# Patient Record
Sex: Male | Born: 1975 | Race: White | Hispanic: No | Marital: Single
Health system: Southern US, Community
[De-identification: ages and names within clinical notes are randomized; demographics above are authoritative.]

---

## 2007-09-12 ENCOUNTER — Emergency Department (HOSPITAL_COMMUNITY): Admission: EM | Admit: 2007-09-12 | Discharge: 2007-09-12 | Payer: Self-pay | Admitting: Emergency Medicine

## 2008-01-26 ENCOUNTER — Emergency Department (HOSPITAL_COMMUNITY): Admission: EM | Admit: 2008-01-26 | Discharge: 2008-01-26 | Payer: Self-pay | Admitting: Emergency Medicine

## 2008-01-29 ENCOUNTER — Inpatient Hospital Stay (HOSPITAL_COMMUNITY): Admission: EM | Admit: 2008-01-29 | Discharge: 2008-02-01 | Payer: Self-pay | Admitting: Emergency Medicine

## 2008-05-21 ENCOUNTER — Ambulatory Visit (HOSPITAL_BASED_OUTPATIENT_CLINIC_OR_DEPARTMENT_OTHER): Admission: RE | Admit: 2008-05-21 | Discharge: 2008-05-21 | Payer: Self-pay | Admitting: Orthopedic Surgery

## 2008-07-29 ENCOUNTER — Encounter: Admission: RE | Admit: 2008-07-29 | Discharge: 2008-08-27 | Payer: Self-pay | Admitting: Orthopedic Surgery

## 2009-09-03 IMAGING — CR DG ANKLE 3+V BILAT
6 series · 6 of 6 positions shown · non-contrast
Comparison: None available

CLINICAL DATA: Follow-up, pain in ankles

BILATERAL ANKLE 3+ VIEW

[t ankle joint ap left]
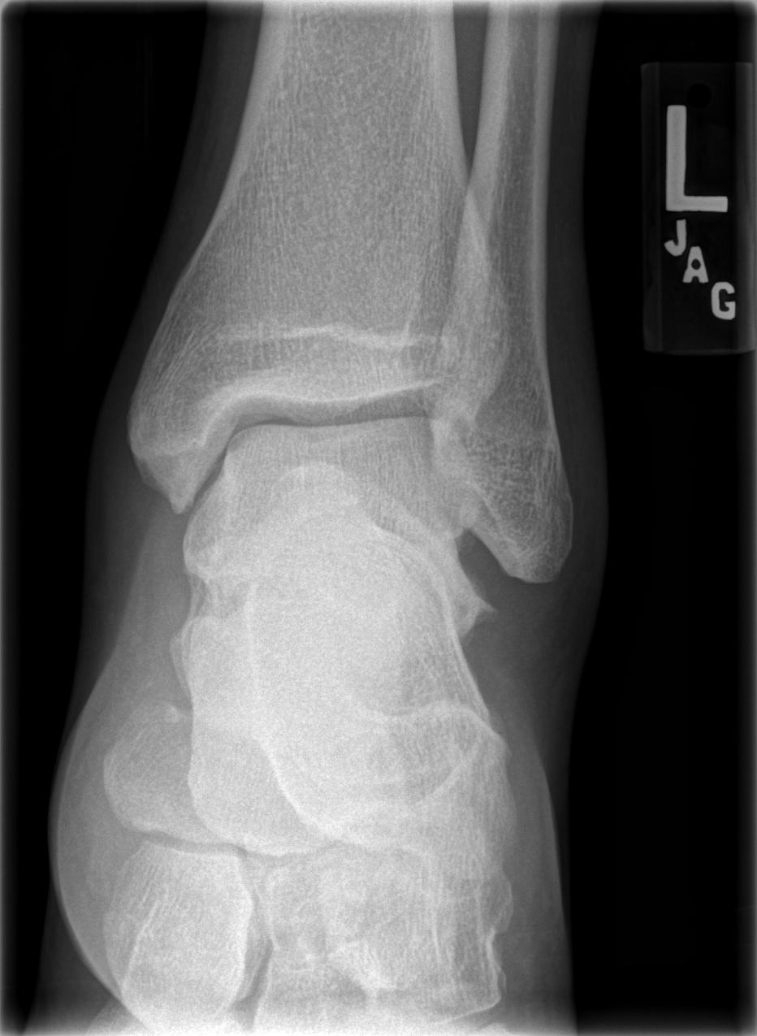

[t ankle joint oblique left]
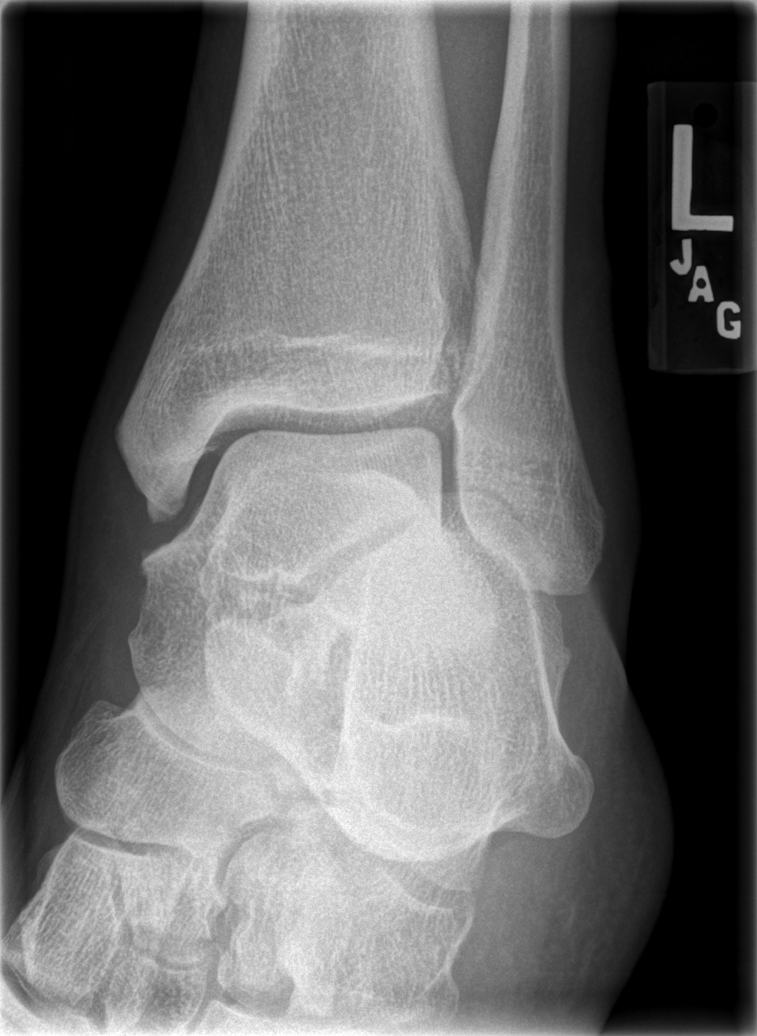

[t ankle joint ap right]
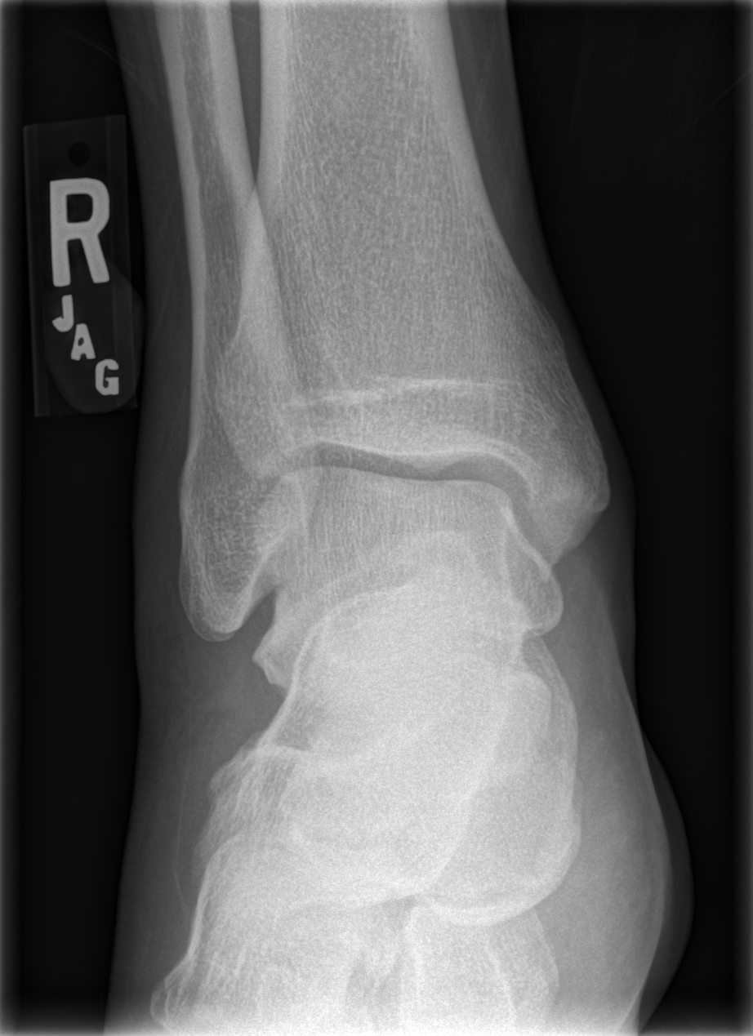

[t ankle joint oblique right]
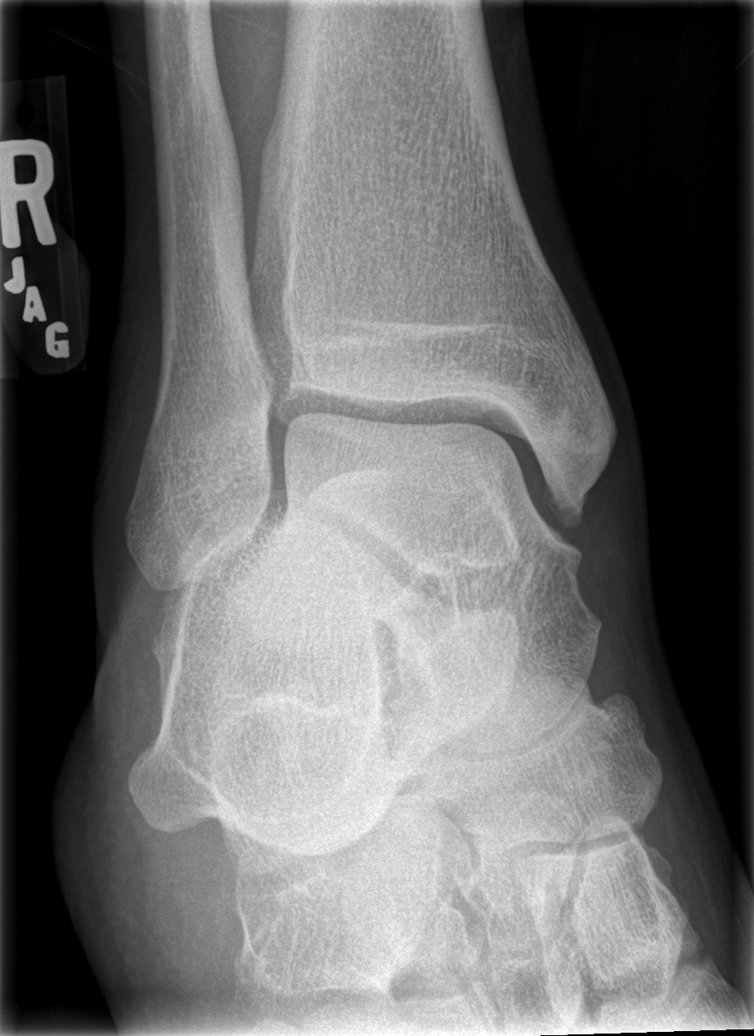

[t ankle joint lat right]
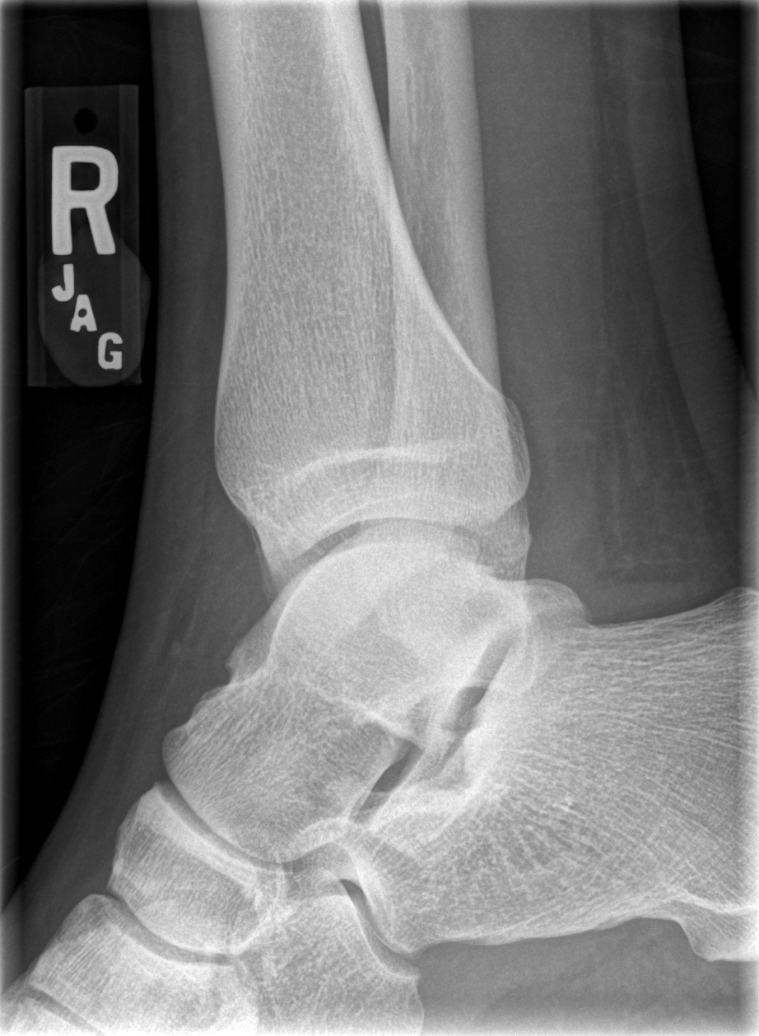

[t ankle joint lat left]
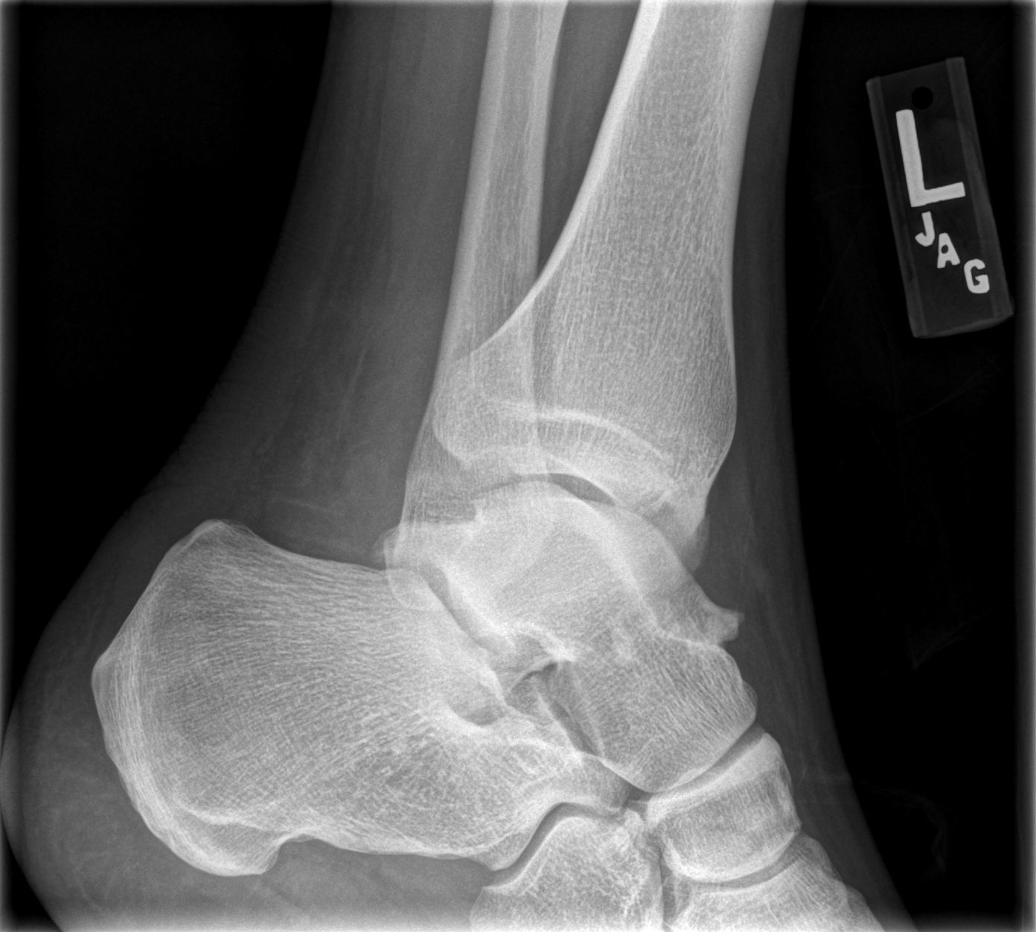

[6 of 6 positions shown; findings below may reference images not displayed]

FINDINGS: Ankle mortise is intact.  The talar dome is normal.  No
evidence of acute fracture.  There is a small fragment in the
medial aspect of the ankle mortise which could represent prior
avulsion fracture.
IMPRESSION: 1..  No evidence of acute osseous abnormality.

## 2011-03-02 NOTE — Op Note (Signed)
NAME:  Danny Saunders, Danny Saunders NO.:  [id number]   MEDICAL RECORD NO.:  [id number]          PATIENT TYPE:  AMB   LOCATION:  DSC                          FACILITY:  [facility]   PHYSICIAN:  Leonides Grills, M.D.     DATE OF BIRTH:  [birth year]   DATE OF PROCEDURE:  05/21/2008  DATE OF DISCHARGE:                               OPERATIVE REPORT   PREOPERATIVE DIAGNOSIS:  Chronic left Achilles tendon rupture.   POSTOPERATIVE DIAGNOSIS:  Chronic left Achilles tendon rupture.   OPERATION:  1. Left primary repair Achilles tendon.  2. Left gastroc slide.  3. Left FHL with calcaneal tendon transfer.   ANESTHESIA:  General.   SURGEON:  Leonides Grills, MD   ASSISTANT:  Richardean Canal, PA-C   ESTIMATED BLOOD LOSS:  Minimal.   TOURNIQUET TIME:  Approximately an hour and half.   COMPLICATIONS:  None.   DISPOSITION:  Stable to the PR.   INDICATIONS:  This 35 year old male who is about 6 weeks removed from a  left Achilles tendon rupture.  He was consented for the above procedure.  All risks including infection, neurovascular injury, rupture, persistent  pain, weakness of push-off great toe, and prolonged recovery were all  explained.  Questions were encouraged and answered.   OPERATION:  The patient was taken to the operating room and placed in  supine position after adequate general endotracheal tube anesthesia was  administered as well as Ancef 1 g IV piggyback.  The patient was then  placed in a full lateral position with the operative side down on a  beanbag.  All bony prominences were well padded.  Left lower extremity  was then prepped and draped in a sterile manner.  Over proximally placed  a thigh tourniquet.  The limb was gravity exsanguinated and tourniquet  was elevated to 290 mmHg.  A longitudinal incision over the medial  aspect of the gastrocnemius musculotendinous junction was then made.  Dissection was carried down through the skin and hemostasis was  obtained.  Fascia  was opened in line with the incision.  Conjoined  region was then developed between gastroc and soleus.  Soft tissue was  then elevated off the posterior aspect of the gastrocnemius.  Sural  nerves identified and protected posteriorly.  Gastrocnemius then  released with curved Mayo scissors.  This had an excellent release of  tight gastroc.  There was difficulty developing the conjoined region  between the gastroc and soleus but we were able to essentially do a  Vulpius type of gastroc release, copiously irrigated with normal saline.  Subcu was closed with 3-0 Vicryl.  The skin was closed with 4-0 Monocryl  and subcuticular stitch and Steri-Strips were applied.  We made a  longitudinal incision on the anterolateral aspect of the Achilles  tendon, dissection was carried down through the skin and hemostasis was  obtained.  Fascia was opened in line with the incision Achilles tendon  was then identified.  There was about a 3 or 4 cm gap in between the two  tendons.  A #5 FiberWire was then placed in a modified Krakow stitch  in  each end of the tendon.  The distal tendon was only about 3 cm in  length.  Once the stitch was placed,  we then identified the FHL tendon  muscle belly and then carefully traced this to the sustentaculum tali  and then tenotomized the tendon.  Care was taken to protect the  neurovascular structures medially.  Once this was tenotomized, we then  made a 7-mm drill hole over a cannulated wire into the superior aspect  of the calcaneal tuber.  Once drill hole was made, we then used an  Arthrex tenodesis 7 x 23 mm bioabsorbable screw and used this and placed  the FHL tendon in this drill hole using the tenodesis screw as an  interference screw using the Arthrex protocol.  This had an Interior and spatial designer.  This was done with the ankle in equinus position.  We then  placed the ankle in maximum equinus and did a primary Achilles tendon  repair pulling the two tendons together  using the #5 FiberWire stitch  that was placed in each tendon respectively.  Again this had an  excellent repair but it was tight.  The area was copiously irrigated  with normal saline.  Tourniquet was deflated and hemostasis was  obtained.  Subcu was closed with 2-0 Vicryl and the skin was closed with  4-0 nylon.  Sterile dressing was applied and a modified Jones dressing  was applied with gravity equinus.  The patient stable to the PR.      Leonides Grills, M.D.  Electronically Signed     PB/MEDQ  D:  05/21/2008  T:  05/22/2008  Job:  [id number]
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
## 2011-03-02 NOTE — Discharge Summary (Signed)
NAME:  Danny Saunders, Danny Saunders NO.:  [id number]   MEDICAL RECORD NO.:  [id number]          PATIENT TYPE:  INP   LOCATION:  5028                         FACILITY:  [facility]   PHYSICIAN:  Hillery Aldo, M.D.   DATE OF BIRTH:  [birth year]   DATE OF ADMISSION:  01/29/2008  DATE OF DISCHARGE:  02/01/2008                               DISCHARGE SUMMARY   PRIMARY CARE PHYSICIAN:  Unassigned.   RHEUMATOLOGIST:  Areatha Keas, M.D.   DISCHARGE DIAGNOSES:  1. Reiter's syndrome.  2. Chlamydia trachomatis.  3. History of alcohol abuse.  4. Hypertension.  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  6. Depression.  7. History of polysubstance abuse.   DISCHARGE MEDICATIONS:  1. Prednisone 15 mg daily.  2. Indocin 50 mg q.8 h.  3. Lexapro 20 mg daily.  4. Clonidine 0.1 mg b.i.d.  5. Doxycycline 100 mg b.i.d. x5 days.  6. Protonix 40 mg daily.   CONSULTATIONS:  Areatha Keas, M.D., of Rheumatology.   BRIEF ADMISSION AND HPI:  The patient is a 35 year old male, who  developed progressive polyarthropathy and disability secondary to  painful joints and swelling of the joints.  He was seen in the emergency  department 2 days prior to readmission and was treated with hydrocodone  and steroids, which made the pain better.  He left knee had return of  his symptoms and re-presented for admission.  For full details, please  see the dictated report done by Dr. Roxan Hockey.   PROCEDURES AND DIAGNOSTIC STUDIES:  1. Chest x-ray on January 29, 2008, showed no acute cardiopulmonary      process.  2. Three views of the bilateral ankles showed no evidence of acute      osseous abnormality.  3. Bilateral knee films on January 29, 2008, showed no osseous      abnormality.  4. Lumbar spine films showed chronic compression fracture of L2 with      facet arthropathy at L5.   DISCHARGE LABORATORY VALUES:  C-reactive protein was 26, ESR 65.  Urine  cultures were negative.  Blood cultures were  negative x2.  White blood  cell count was 10.6, hemoglobin 14.5, hematocrit 41.5, and platelets  378.  Epstein-Barr virus IgG was high at 5.21.  Hemoglobin A1c was 4.9.  RPR was nonreactive.  Chlamydia probe was positive.  GC probe was  negative.   HOSPITAL COURSE:  1. He was put on nonsteroidal antiinflammatory medicines.  Because of      concerns of Reiter's syndrome, consultation was requested and      kindly provided by Dr. 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  He recommended increasing Indocin to its      current dose of 50 t.i.d. with proton pump inhibitor therapy for GI      prophylaxis.  The patient was put on prednisone secondary to lack      of satisfactory clinical response and ongoing debility.  At this      point, his joint swelling is beginning to subside, and he  is      beginning to have greater arrange motion and will be discharged on      prednisone to follow up with Dr. Phylliss Bob for ultimate tapering.  2. Chlamydia trachomatis:  The patient was put on doxycycline and will      complete a 1-week course of therapy.  3. History of alcohol abuse:  The patient did not have any evidence of      DTs or withdrawal problems.  4. He was counseled on cessation.  5. Hypertension:  The patient was started on Norvasc.  Cost may be an      issue, and therefore, he was switched over to clonidine as an      outpatient.  He should follow up with Dr. Phylliss Bob, who can refer him      to a primary care Derrich Gaby for ongoing evaluation and treatment.  6. Depression:  The patient was continued on his usual dose of      Lexapro.  7. Polysubstance abuse:  The patient will be referred to [facility] for      outpatient treatment if he desires.   DISPOSITION:  The patient is medically stable and will be discharged  home today.  Again, he was provided with Dr. Renaldo Reel clinic number and  advised to follow up with him next week.      Hillery Aldo, M.D.  Electronically  Signed     CR/MEDQ  D:  02/01/2008  T:  02/02/2008  Job:  [id number]   cc:   Areatha Keas, M.D.

## 2011-07-13 LAB — DIFFERENTIAL
Basophils Absolute: 0
Basophils Relative: 0
Eosinophils Absolute: 0
Eosinophils Relative: 2
Lymphocytes Relative: 10 — ABNORMAL LOW
Lymphs Abs: 0.9
Monocytes Absolute: 1.2 — ABNORMAL HIGH
Monocytes Relative: 13 — ABNORMAL HIGH
Neutro Abs: 7.2
Neutrophils Relative %: 89 — ABNORMAL HIGH

## 2011-07-13 LAB — BASIC METABOLIC PANEL
Calcium: 8.9
GFR calc Af Amer: >60
GFR calc non Af Amer: >60
Glucose, Bld: 93
Potassium: 4.4
Sodium: 132 — ABNORMAL LOW

## 2011-07-13 LAB — CBC
HCT: 40.5
HCT: 41.7
Hemoglobin: 13.8
Hemoglobin: 14.4
MCHC: 33.8
MCV: 90.7
Platelets: 431 — ABNORMAL HIGH
Platelets: 378
RBC: 4.47
RBC: 4.64
RBC: 4.62
RDW: 12.8
WBC: 15.5 — ABNORMAL HIGH
WBC: 18.8 — ABNORMAL HIGH
WBC: 10.6 — ABNORMAL HIGH

## 2011-07-13 LAB — EPSTEIN-BARR VIRUS VCA ANTIBODY PANEL
EBV EA IgG: 0.54
EBV NA IgG: 3.68 — ABNORMAL HIGH

## 2011-07-13 LAB — URINE CULTURE
Colony Count: NO GROWTH
Colony Count: NO GROWTH

## 2011-07-13 LAB — COMPREHENSIVE METABOLIC PANEL
ALT: 87 — ABNORMAL HIGH
Alkaline Phosphatase: 42
BUN: 15
Chloride: 97
Glucose, Bld: 100 — ABNORMAL HIGH
Potassium: 3.8
Sodium: 135
Total Bilirubin: 0.3
Total Protein: 6.9

## 2011-07-13 LAB — CULTURE, BLOOD (ROUTINE X 2): Culture: NO GROWTH

## 2011-07-13 LAB — URINALYSIS, ROUTINE W REFLEX MICROSCOPIC
Glucose, UA: NEGATIVE
Hgb urine dipstick: NEGATIVE
Protein, ur: 30 — AB
Specific Gravity, Urine: 1.028
pH: 6

## 2011-07-13 LAB — ANA: Anti Nuclear Antibody(ANA): NEGATIVE

## 2011-07-13 LAB — CK: Total CK: 53

## 2011-07-13 LAB — URINE MICROSCOPIC-ADD ON

## 2011-07-13 LAB — CK TOTAL AND CKMB (NOT AT ARMC)
CK, MB: 0.8
Relative Index: INVALID
Total CK: 37

## 2011-07-13 LAB — RAPID URINE DRUG SCREEN, HOSP PERFORMED
Amphetamines: NOT DETECTED
Cocaine: NOT DETECTED
Tetrahydrocannabinol: POSITIVE — AB

## 2011-07-13 LAB — POCT I-STAT, CHEM 8
HCT: 45
Hemoglobin: 15.3
Potassium: 4.4
Sodium: 135

## 2011-07-13 LAB — GC/CHLAMYDIA PROBE AMP, GENITAL: GC Probe Amp, Genital: NEGATIVE

## 2011-07-13 LAB — HLA-B27 ANTIGEN

## 2011-07-13 LAB — HEPATIC FUNCTION PANEL
Albumin: 3.1 — ABNORMAL LOW
Bilirubin, Direct: 0.2
Total Bilirubin: 1.1

## 2011-07-13 LAB — CARDIAC PANEL(CRET KIN+CKTOT+MB+TROPI)
CK, MB: 0.6
Total CK: 35

## 2011-07-13 LAB — LIPID PANEL
HDL: 32 — ABNORMAL LOW
Triglycerides: 76
VLDL: 15

## 2011-07-13 LAB — HIV ANTIBODY (ROUTINE TESTING W REFLEX): HIV: NONREACTIVE

## 2011-07-13 LAB — C-REACTIVE PROTEIN: CRP: 26 — ABNORMAL HIGH (ref <0.6)

## 2011-07-13 LAB — URINALYSIS, MICROSCOPIC ONLY
Ketones, ur: NEGATIVE
Leukocytes, UA: NEGATIVE
Nitrite: NEGATIVE
Urobilinogen, UA: 1
pH: 6.5

## 2011-07-13 LAB — HEMOGLOBIN A1C
Hgb A1c MFr Bld: 4.9
Mean Plasma Glucose: 97

## 2011-07-13 LAB — RHEUMATOID FACTOR: Rhuematoid fact SerPl-aCnc: <20

## 2011-07-13 LAB — TROPONIN I: Troponin I: <0.01

## 2011-07-13 LAB — TSH: TSH: 0.869

## 2011-07-13 LAB — RPR: RPR Ser Ql: NONREACTIVE

## 2011-07-16 LAB — POCT HEMOGLOBIN-HEMACUE: Hemoglobin: 17

## 2011-07-16 LAB — BASIC METABOLIC PANEL
Calcium: 9.4
GFR calc Af Amer: >60
GFR calc non Af Amer: >60
Potassium: 3.7
Sodium: 136

## 2011-07-27 LAB — RPR: RPR Ser Ql: NONREACTIVE

## 2011-07-27 LAB — GC/CHLAMYDIA PROBE AMP, GENITAL
Chlamydia, DNA Probe: NEGATIVE
GC Probe Amp, Genital: NEGATIVE

## 2017-05-09 ENCOUNTER — Encounter: Payer: Self-pay | Admitting: Podiatry

## 2017-05-09 ENCOUNTER — Ambulatory Visit (INDEPENDENT_AMBULATORY_CARE_PROVIDER_SITE_OTHER): Payer: BLUE CROSS/BLUE SHIELD | Admitting: Podiatry

## 2017-05-09 DIAGNOSIS — B351 Tinea unguium: Secondary | ICD-10-CM | POA: Diagnosis not present

## 2017-05-09 MED ORDER — TERBINAFINE HCL 250 MG PO TABS: 250.0000 mg | ORAL_TABLET | Freq: Every day | ORAL | 0 refills | Status: AC

## 2017-05-09 NOTE — Progress Notes (Signed)
   Subjective: Patient presents today for possible treatment and evaluation of fungal nails to the bilateral great toes. Patient states his bilateral great toenails are thick and discolored for several years now. Patient does have a history of psoriatic arthritis.  Objective: Physical Exam General: The patient is alert and oriented x3 in no acute distress.  Dermatology: Hyperkeratotic, discolored, thickened, onychodystrophy of nails noted bilaterally.  Skin is warm, dry and supple bilateral lower extremities. Negative for open lesions or macerations.  Vascular: Palpable pedal pulses bilaterally. No edema or erythema noted. Capillary refill within normal limits.  Neurological: Epicritic and protective threshold grossly intact bilaterally.   Musculoskeletal Exam: Range of motion within normal limits to all pedal and ankle joints bilateral. Muscle strength 5/5 in all groups bilateral.   Assessment: #1 onychodystrophy bilateral toenails #2 possible onychomycosis #3 hyperkeratotic nails bilateral  Plan of Care:  #1 Patient was evaluated. #2 Orders for liver function tests were ordered today.  #3 prescription for Lamisil 250 mg #90 #4 mechanical debridement of the bilateral great toenails was performed using a nail nipper and dry multiple #5 return to clinic when necessary.  Felecia ShellingBrent M. Evans, DPM [facility]  Dr. Felecia ShellingBrent M. Evans, DPM    [address]                                        [place], [place] [address]                Office [phone]  Fax [phone]

## 2017-05-10 LAB — HEPATIC FUNCTION PANEL
ALK PHOS: 40 U/L (ref 40–115)
ALT: 21 U/L (ref 9–46)
AST: 18 U/L (ref 10–40)
Albumin: 4.4 g/dL (ref 3.6–5.1)
BILIRUBIN INDIRECT: 0.6 mg/dL (ref 0.2–1.2)
BILIRUBIN TOTAL: 0.8 mg/dL (ref 0.2–1.2)
Bilirubin, Direct: 0.2 mg/dL (ref <=0.2)
TOTAL PROTEIN: 7.2 g/dL (ref 6.1–8.1)

## 2017-08-05 ENCOUNTER — Telehealth: Payer: Self-pay | Admitting: *Deleted

## 2017-08-05 NOTE — Telephone Encounter (Signed)
Refill request of terbinafine. Pt has received therapeutic dose #90. Refill request denied.
